# Patient Record
Sex: Female | Born: 1980 | Race: Black or African American | Hispanic: No | Marital: Single | Smoking: Former smoker
Health system: Southern US, Community
[De-identification: ages and names within clinical notes are randomized; demographics above are authoritative.]

## PROBLEM LIST (undated history)

## (undated) DIAGNOSIS — E7800 Pure hypercholesterolemia, unspecified: Secondary | ICD-10-CM

---

## 2012-10-08 ENCOUNTER — Emergency Department (HOSPITAL_BASED_OUTPATIENT_CLINIC_OR_DEPARTMENT_OTHER)
Admission: EM | Admit: 2012-10-08 | Discharge: 2012-10-08 | Disposition: A | Discharge status: Home / Self Care | Payer: PRIVATE HEALTH INSURANCE | Attending: Emergency Medicine | Admitting: Emergency Medicine

## 2012-10-08 ENCOUNTER — Encounter (HOSPITAL_BASED_OUTPATIENT_CLINIC_OR_DEPARTMENT_OTHER): Payer: Self-pay | Admitting: Family Medicine

## 2012-10-08 DIAGNOSIS — Z3202 Encounter for pregnancy test, result negative: Secondary | ICD-10-CM | POA: Insufficient documentation

## 2012-10-08 DIAGNOSIS — K529 Noninfective gastroenteritis and colitis, unspecified: Secondary | ICD-10-CM

## 2012-10-08 DIAGNOSIS — K5289 Other specified noninfective gastroenteritis and colitis: Secondary | ICD-10-CM | POA: Insufficient documentation

## 2012-10-08 DIAGNOSIS — R197 Diarrhea, unspecified: Secondary | ICD-10-CM | POA: Insufficient documentation

## 2012-10-08 DIAGNOSIS — F17200 Nicotine dependence, unspecified, uncomplicated: Secondary | ICD-10-CM | POA: Insufficient documentation

## 2012-10-08 LAB — CBC WITH DIFFERENTIAL/PLATELET
Basophils Relative: 0 % (ref 0–1)
Eosinophils Absolute: 0.1 10*3/uL (ref 0.0–0.7)
MCH: 32.6 pg (ref 26.0–34.0)
MCHC: 35 g/dL (ref 30.0–36.0)
Neutrophils Relative %: 56 % (ref 43–77)
Platelets: 188 10*3/uL (ref 150–400)
RDW: 11.7 % (ref 11.5–15.5)

## 2012-10-08 LAB — URINALYSIS, ROUTINE W REFLEX MICROSCOPIC
Nitrite: NEGATIVE
Specific Gravity, Urine: 1.035 — ABNORMAL HIGH (ref 1.005–1.030)
Urobilinogen, UA: 1 mg/dL (ref 0.0–1.0)

## 2012-10-08 LAB — BASIC METABOLIC PANEL
GFR calc Af Amer: >90 mL/min (ref >90)
GFR calc non Af Amer: >90 mL/min (ref >90)
Potassium: 3.6 mEq/L (ref 3.5–5.1)
Sodium: 140 mEq/L (ref 135–145)

## 2012-10-08 MED ORDER — METOCLOPRAMIDE HCL 10 MG PO TABS: 10.0000 mg | ORAL_TABLET | Freq: Four times a day (QID) | ORAL | Status: DC | PRN

## 2012-10-08 MED ORDER — ONDANSETRON HCL 4 MG/2ML IJ SOLN
4.0000 mg | Freq: Once | INTRAMUSCULAR | Status: AC
  Administered 2012-10-08: 4 mg via INTRAVENOUS
  Filled 2012-10-08: qty 2

## 2012-10-08 MED ORDER — SODIUM CHLORIDE 0.9 % IV SOLN: Freq: Once | INTRAVENOUS | Status: DC

## 2012-10-08 MED ORDER — LOPERAMIDE HCL 2 MG PO CAPS
4.0000 mg | ORAL_CAPSULE | Freq: Once | ORAL | Status: AC
  Administered 2012-10-08: 4 mg via ORAL
  Filled 2012-10-08: qty 2

## 2012-10-08 MED ORDER — SODIUM CHLORIDE 0.9 % IV BOLUS (SEPSIS)
1000.0000 mL | Freq: Once | INTRAVENOUS | Status: AC
  Administered 2012-10-08: 1000 mL via INTRAVENOUS

## 2012-10-08 NOTE — ED Notes (Signed)
Pt c/o vomiting, diarrhea and fever since MOnday. Pt sts fever broke but continues to have vomiting and diarrhea.

## 2012-10-08 NOTE — ED Provider Notes (Signed)
History     CSN: [id number]  Arrival date & time 10/08/12  1021   First MD Initiated Contact with Patient 10/08/12 1033      Chief Complaint  Patient presents with  . Emesis  . Diarrhea    (Consider location/radiation/quality/duration/timing/severity/associated sxs/prior treatment) Patient is a 32 y.o. female presenting with vomiting and diarrhea. The history is provided by the patient.  Emesis  Associated symptoms include diarrhea.  Diarrhea The primary symptoms include vomiting and diarrhea.  She has been sick for the last 2 days. She initially had fever which was as high as 102.1, but is not running a fever today. She has had chills and sweats. She has vomited multiple times and is now vomiting only some yellowish material. She's not been able to hold anything down. She is having 4-5 bowel movements a day. She denies any blood or mucus in the emesis or stool. She has had sick contacts. Symptoms are severe. Nothing makes it better nothing makes it worse. She has not taken anything at home to help.  History reviewed. No pertinent past medical history.  History reviewed. No pertinent past surgical history.  No family history on file.  History  Substance Use Topics  . Smoking status: Current Every Day Smoker  . Smokeless tobacco: Not on file  . Alcohol Use: Yes    OB History    Grav Para Term Preterm Abortions TAB SAB Ect Mult Living                  Review of Systems  Gastrointestinal: Positive for vomiting and diarrhea.  All other systems reviewed and are negative.    Allergies  Review of patient's allergies indicates no known allergies.  Home Medications  No current outpatient prescriptions on file.  BP 127/92  Pulse 125  Temp 98.4 F (36.9 C)  Resp 20  SpO2 100%  LMP 09/30/2012  Physical Exam  Nursing note and vitals reviewed. 32 year old female, resting comfortably and in no acute distress. Vital signs are significant for tachycardia with heart rate  125, and mild hypertension with blood pressure 127/92. Oxygen saturation is 100%, which is normal. Head is normocephalic and atraumatic. PERRLA, EOMI. Oropharynx is clear. Mucous membranes are slightly dry Neck is nontender and supple without adenopathy or JVD. Back is nontender and there is no CVA tenderness. Lungs are clear without rales, wheezes, or rhonchi. Chest is nontender. Heart is tachycardic without murmur. Abdomen is soft, flat, nontender without masses or hepatosplenomegaly and peristalsis is hypoactive. Extremities have no cyanosis or edema, full range of motion is present. Skin is warm and dry without rash. Neurologic: Mental status is normal, cranial nerves are intact, there are no motor or sensory deficits.   ED Course  Procedures (including critical care time)  Results for orders placed during the hospital encounter of 10/08/12  CBC WITH DIFFERENTIAL      Component Value Range   WBC 8.0  4.0 - 10.5 K/uL   RBC 4.29  3.87 - 5.11 MIL/uL   Hemoglobin 14.0  12.0 - 15.0 g/dL   HCT 40.9  81.1 - 91.4 %   MCV 93.2  78.0 - 100.0 fL   MCH 32.6  26.0 - 34.0 pg   MCHC 35.0  30.0 - 36.0 g/dL   RDW 78.2  95.6 - 21.3 %   Platelets 188  150 - 400 K/uL   Neutrophils Relative 56  43 - 77 %   Neutro Abs 4.5  1.7 -  7.7 K/uL   Lymphocytes Relative 34  12 - 46 %   Lymphs Abs 2.7  0.7 - 4.0 K/uL   Monocytes Relative 9  3 - 12 %   Monocytes Absolute 0.7  0.1 - 1.0 K/uL   Eosinophils Relative 1  0 - 5 %   Eosinophils Absolute 0.1  0.0 - 0.7 K/uL   Basophils Relative 0  0 - 1 %   Basophils Absolute 0.0  0.0 - 0.1 K/uL  BASIC METABOLIC PANEL      Component Value Range   Sodium 140  135 - 145 mEq/L   Potassium 3.6  3.5 - 5.1 mEq/L   Chloride 103  96 - 112 mEq/L   CO2 24  19 - 32 mEq/L   Glucose, Bld 136 (*) 70 - 99 mg/dL   BUN 8  6 - 23 mg/dL   Creatinine, Ser 1.61  0.50 - 1.10 mg/dL   Calcium 9.8  8.4 - 09.6 mg/dL   GFR calc non Af Amer >90  >90 mL/min   GFR calc Af Amer >90   >90 mL/min  URINALYSIS, ROUTINE W REFLEX MICROSCOPIC      Component Value Range   Color, Urine AMBER (*) YELLOW   APPearance CLOUDY (*) CLEAR   Specific Gravity, Urine 1.035 (*) 1.005 - 1.030   pH 6.0  5.0 - 8.0   Glucose, UA NEGATIVE  NEGATIVE mg/dL   Hgb urine dipstick NEGATIVE  NEGATIVE   Bilirubin Urine SMALL (*) NEGATIVE   Ketones, ur 40 (*) NEGATIVE mg/dL   Protein, ur NEGATIVE  NEGATIVE mg/dL   Urobilinogen, UA 1.0  0.0 - 1.0 mg/dL   Nitrite NEGATIVE  NEGATIVE   Leukocytes, UA NEGATIVE  NEGATIVE  PREGNANCY, URINE      Component Value Range   Preg Test, Ur NEGATIVE  NEGATIVE    1. Gastroenteritis       MDM  Vomiting and diarrhea which most likely represents viral gastroenteritis. She'll be given IV hydration, IV ondansetron and oral loperamide.  She feels considerably better after above noted treatment. She is discharged with prescription for metoclopramide and told to use over-the-counter loperamide as needed. She is still complaining of some abdominal soreness, but there is no evidence of any other acute abdominal pathology. She is given a work release for the next 36 hours.  Dione Booze, MD 10/08/12 1213

## 2018-08-26 ENCOUNTER — Ambulatory Visit: Payer: Self-pay | Admitting: Nurse Practitioner

## 2018-08-26 VITALS — BP 118/70 | HR 92 | Temp 98.1°F | Resp 16 | Ht 68.0 in | Wt 126.6 lb

## 2018-08-26 DIAGNOSIS — F17200 Nicotine dependence, unspecified, uncomplicated: Secondary | ICD-10-CM

## 2018-08-26 DIAGNOSIS — L732 Hidradenitis suppurativa: Secondary | ICD-10-CM

## 2018-08-26 MED ORDER — DOXYCYCLINE HYCLATE 100 MG PO TABS: 100.0000 mg | ORAL_TABLET | Freq: Two times a day (BID) | ORAL | 0 refills | Status: DC

## 2018-08-26 NOTE — Patient Instructions (Addendum)
Warm compress to area 2-3 times a day Smoking cessation discussed and encouraged Take all of meds as directed Set up mychart RTC if symptoms persist or worsen or see PCP

## 2018-08-26 NOTE — Progress Notes (Signed)
   Subjective:    Patient ID: Kristi Turner, female    DOB: 08/04/1981, 37 y.o.   MRN: [id number]  HPI Kristi Turner [facility] today with c/o left armpit bump that is painful and getting larger. She reports she's unable to shave. When the bump first appeared it wasn't painful but her bra strap has rubbed it. She reports this has been going on x 2 weeks. Was seen at PCP office a few weeks back and had CPE and reports she had the bump at that time but it wasn't painful. Denies fever, or skin issues. Has used heating pad but it hasn't improved.  Kristi Turner admits smokes 4 cigarettes/day and has smoked for 10 years.   Review of Systems  Constitutional: Negative for fever.  Skin:       Painful bump to the left armpit       Objective:   Physical Exam  Constitutional: She is oriented to person, place, and time. She appears well-developed and well-nourished.  HENT:  Head: Normocephalic.  Neck: Normal range of motion.  Pulmonary/Chest: Effort normal. No respiratory distress.  Musculoskeletal: Normal range of motion.  Neurological: She is alert and oriented to person, place, and time.  Skin: Skin is warm and dry.  Left axillary nodule with erythema, tenderness and edema. No discharge but noted another circular lesion at the center of the lesion  Psychiatric: She has a normal mood and affect.  Vitals reviewed.         Assessment & Plan:

## 2018-08-26 NOTE — Progress Notes (Deleted)
Duplicate

## 2019-10-15 ENCOUNTER — Ambulatory Visit: Payer: PRIVATE HEALTH INSURANCE | Admitting: Family Medicine

## 2019-10-15 VITALS — BP 126/74 | HR 94 | Ht 67.0 in | Wt 124.6 lb

## 2019-10-15 DIAGNOSIS — Z789 Other specified health status: Secondary | ICD-10-CM

## 2019-10-15 NOTE — Progress Notes (Signed)
  Subjective:     Patient ID: Kristi Turner, female   DOB: 02/28/1981, 39 y.o.   MRN: [id number]  HPI Yoshie presents to the [facility] clinic today for her required wellness visit for her insurance. We reviewed her PMH and wellness goals and addressed any concerns she has. She does have a PCP that she sees yearly for her physicals.   Pap smear: August 2020 normal  Declined flu vaccine Former smoker, quit 1.5 years ago Denies any health concerns  No past medical history on file. No Known Allergies No current outpatient medications on file.     Review of Systems  Constitutional: Negative for chills, fatigue, fever and unexpected weight change.  HENT: Negative for congestion, ear pain, sinus pressure, sinus pain and sore throat.   Eyes: Negative for discharge and visual disturbance.  Respiratory: Negative for cough, shortness of breath and wheezing.   Cardiovascular: Negative for chest pain and leg swelling.  Gastrointestinal: Negative for abdominal pain, blood in stool, constipation, diarrhea, nausea and vomiting.  Genitourinary: Negative for difficulty urinating and hematuria.  Skin: Negative for color change.  Neurological: Negative for dizziness, weakness, light-headedness and headaches.  Hematological: Negative for adenopathy.  All other systems reviewed and are negative.      Objective:   Physical Exam Vitals and nursing note reviewed.  Constitutional:      General: She is not in acute distress.    Appearance: Normal appearance. She is well-developed. She is not toxic-appearing.  HENT:     Head: Normocephalic and atraumatic.  Eyes:     General:        Right eye: No discharge.        Left eye: No discharge.  Cardiovascular:     Rate and Rhythm: Normal rate and regular rhythm.     Heart sounds: Normal heart sounds. No murmur.  Pulmonary:     Effort: Pulmonary effort is normal. No respiratory distress.     Breath sounds: Normal breath sounds.  Musculoskeletal:      Cervical back: Neck supple.  Skin:    General: Skin is warm and dry.  Neurological:     Mental Status: She is alert and oriented to person, place, and time.  Psychiatric:        Mood and Affect: Mood normal.        Behavior: Behavior normal.        Assessment:     Participant in health and wellness plan      Plan:     1. Pt is doing well. No concerns at this time. Encouraged to continue exercising and following healthy diet. Keep all appointments with PCP. F/u prn.

## 2020-05-31 ENCOUNTER — Encounter (HOSPITAL_BASED_OUTPATIENT_CLINIC_OR_DEPARTMENT_OTHER): Payer: Self-pay | Admitting: *Deleted

## 2020-05-31 ENCOUNTER — Other Ambulatory Visit: Payer: Self-pay

## 2020-05-31 DIAGNOSIS — R002 Palpitations: Secondary | ICD-10-CM | POA: Insufficient documentation

## 2020-05-31 DIAGNOSIS — Z87891 Personal history of nicotine dependence: Secondary | ICD-10-CM | POA: Insufficient documentation

## 2020-05-31 NOTE — ED Triage Notes (Signed)
C/o palpitations x 1 week , cp today , seen by PMD x 4 days ago for same

## 2020-06-01 ENCOUNTER — Emergency Department (HOSPITAL_BASED_OUTPATIENT_CLINIC_OR_DEPARTMENT_OTHER): Payer: PRIVATE HEALTH INSURANCE

## 2020-06-01 ENCOUNTER — Emergency Department (HOSPITAL_BASED_OUTPATIENT_CLINIC_OR_DEPARTMENT_OTHER)
Admission: EM | Admit: 2020-06-01 | Discharge: 2020-06-01 | Disposition: A | Discharge status: Home / Self Care | Payer: PRIVATE HEALTH INSURANCE | Attending: Emergency Medicine | Admitting: Emergency Medicine

## 2020-06-01 ENCOUNTER — Other Ambulatory Visit: Payer: Self-pay

## 2020-06-01 DIAGNOSIS — R002 Palpitations: Secondary | ICD-10-CM

## 2020-06-01 LAB — CBC WITH DIFFERENTIAL/PLATELET
Abs Immature Granulocytes: 0.01 10*3/uL (ref 0.00–0.07)
Basophils Absolute: 0.1 10*3/uL (ref 0.0–0.1)
Basophils Relative: 1 %
Eosinophils Absolute: 0.1 10*3/uL (ref 0.0–0.5)
Eosinophils Relative: 1 %
HCT: 38.8 % (ref 36.0–46.0)
Hemoglobin: 13.3 g/dL (ref 12.0–15.0)
Immature Granulocytes: 0 %
Lymphocytes Relative: 50 %
Lymphs Abs: 5 10*3/uL — ABNORMAL HIGH (ref 0.7–4.0)
MCH: 31.9 pg (ref 26.0–34.0)
MCHC: 34.3 g/dL (ref 30.0–36.0)
MCV: 93 fL (ref 80.0–100.0)
Monocytes Absolute: 1 10*3/uL (ref 0.1–1.0)
Monocytes Relative: 10 %
Neutro Abs: 3.6 10*3/uL (ref 1.7–7.7)
Neutrophils Relative %: 38 %
Platelets: 242 10*3/uL (ref 150–400)
RBC: 4.17 MIL/uL (ref 3.87–5.11)
RDW: 11.5 % (ref 11.5–15.5)
WBC: 9.7 10*3/uL (ref 4.0–10.5)
nRBC: 0 % (ref 0.0–0.2)

## 2020-06-01 LAB — COMPREHENSIVE METABOLIC PANEL
ALT: 9 U/L (ref 0–44)
AST: 18 U/L (ref 15–41)
Albumin: 4.9 g/dL (ref 3.5–5.0)
Alkaline Phosphatase: 53 U/L (ref 38–126)
Anion gap: 11 (ref 5–15)
BUN: 10 mg/dL (ref 6–20)
CO2: 22 mmol/L (ref 22–32)
Calcium: 9.5 mg/dL (ref 8.9–10.3)
Chloride: 104 mmol/L (ref 98–111)
Creatinine, Ser: 0.67 mg/dL (ref 0.44–1.00)
GFR calc Af Amer: >60 mL/min (ref >60)
GFR calc non Af Amer: >60 mL/min (ref >60)
Glucose, Bld: 138 mg/dL — ABNORMAL HIGH (ref 70–99)
Potassium: 3.5 mmol/L (ref 3.5–5.1)
Sodium: 137 mmol/L (ref 135–145)
Total Bilirubin: 1 mg/dL (ref 0.3–1.2)
Total Protein: 8 g/dL (ref 6.5–8.1)

## 2020-06-01 LAB — TROPONIN I (HIGH SENSITIVITY): Troponin I (High Sensitivity): <2 ng/L (ref <18)

## 2020-06-01 LAB — D-DIMER, QUANTITATIVE: D-Dimer, Quant: 0.38 ug/mL-FEU (ref 0.00–0.50)

## 2020-06-01 MED ORDER — SODIUM CHLORIDE 0.9 % IV BOLUS
1000.0000 mL | Freq: Once | INTRAVENOUS | Status: AC
  Administered 2020-06-01: 1000 mL via INTRAVENOUS

## 2020-06-01 NOTE — ED Provider Notes (Signed)
[facility] EMERGENCY DEPARTMENT Provider Note   CSN: [id number] Arrival date & time: 05/31/20  2340     History Chief Complaint  Patient presents with  . Palpitations    Kristi Turner is a 39 y.o. female.  Patient presents to the emergency department with a chief complaint of palpitations.  She states that she has history of the same, but never experienced them quite as bad as she did tonight.  She reports associated chest tightness that is uncomfortable, but she says it is not really a "pain."  She denies any history of PE or DVT.  She states that she does use coffee and occasional energy drinks, but denies any other stimulant use or methamphetamine use.  She denies any recent travel or surgery.  She is not on OCPs.  She has seen her doctor for the same, but has not had a diagnosis established.  She tried taking 20 mg of propranolol which was prescribed by her PCP, but did not notice any improvement.  She is scheduled to see a cardiologist in a few weeks.  The history is provided by the patient. No language interpreter was used.       History reviewed. No pertinent past medical history.  There are no problems to display for this patient.   History reviewed. No pertinent surgical history.   OB History   No obstetric history on file.     No family history on file.  Social History   Tobacco Use  . Smoking status: Former Games developer  . Smokeless tobacco: Never Used  . Tobacco comment: Quit 2019  Substance Use Topics  . Alcohol use: Yes  . Drug use: No    Home Medications Prior to Admission medications   Not on File    Allergies    Patient has no known allergies.  Review of Systems   Review of Systems  All other systems reviewed and are negative.   Physical Exam Updated Vital Signs BP (!) 137/100   Pulse (!) 133   Temp 98.8 F (37.1 C) (Oral)   Resp 18   Ht 5\' 7"  (1.702 m)   Wt 57.6 kg   LMP 05/12/2020   SpO2 99%   BMI 19.89 kg/m    Physical Exam Vitals and nursing note reviewed.  Constitutional:      General: She is not in acute distress.    Appearance: She is well-developed.  HENT:     Head: Normocephalic and atraumatic.  Eyes:     Conjunctiva/sclera: Conjunctivae normal.  Cardiovascular:     Rate and Rhythm: Regular rhythm. Tachycardia present.     Heart sounds: No murmur heard.   Pulmonary:     Effort: Pulmonary effort is normal. No respiratory distress.     Breath sounds: Normal breath sounds.  Abdominal:     Palpations: Abdomen is soft.     Tenderness: There is no abdominal tenderness.  Musculoskeletal:        General: Normal range of motion.     Cervical back: Neck supple.  Skin:    General: Skin is warm and dry.  Neurological:     Mental Status: She is alert and oriented to person, place, and time.  Psychiatric:        Mood and Affect: Mood normal.        Behavior: Behavior normal.     ED Results / Procedures / Treatments   Labs (all labs ordered are listed, but only abnormal results are displayed)  Labs Reviewed  CBC WITH DIFFERENTIAL/PLATELET - Abnormal; Notable for the following components:      Result Value   Lymphs Abs 5.0 (*)    All other components within normal limits  COMPREHENSIVE METABOLIC PANEL - Abnormal; Notable for the following components:   Glucose, Bld 138 (*)    All other components within normal limits  D-DIMER, QUANTITATIVE (NOT AT [facility])  TROPONIN I (HIGH SENSITIVITY)    EKG EKG Interpretation  Date/Time:  Wednesday June 01 2020 00:01:18 EDT Ventricular Rate:  134 PR Interval:    QRS Duration: 78 QT Interval:  364 QTC Calculation: 543 R Axis:   82 Text Interpretation: Sinus tachycardia Confirmed by Nicanor Alcon, April ([id number]) on 06/01/2020 5:27:49 AM 5573 ED ECG REPORT  I personally interpreted this EKG   Date: 06/01/2020   Rate: 107  Rhythm: sinus tachycardia  QRS Axis: normal  Intervals: 345/461  ST/T Wave abnormalities: normal  Conduction  Disutrbances:none  Narrative Interpretation: slower  Old EKG Reviewed: changes noted    Radiology DG Chest 2 View  Result Date: 06/01/2020 CLINICAL DATA:  Palpitations for 1 week, chest pain today EXAM: CHEST - 2 VIEW COMPARISON:  None. FINDINGS: The heart size and mediastinal contours are within normal limits. Both lungs are clear. The visualized skeletal structures are unremarkable. IMPRESSION: No active cardiopulmonary disease. Electronically Signed   By: Sharlet Salina M.D.   On: 06/01/2020 00:21    Procedures Procedures (including critical care time)  Medications Ordered in ED Medications  sodium chloride 0.9 % bolus 1,000 mL (has no administration in time range)    ED Course  I have reviewed the triage vital signs and the nursing notes.  Pertinent labs & imaging results that were available during my care of the patient were reviewed by me and considered in my medical decision making (see chart for details).    MDM Rules/Calculators/A&P                          This patient complains of palpitations, this involves an extensive number of treatment options, and is a complaint that carries with it a high risk of complications and morbidity.    Pertinent Labs I ordered, reviewed, and interpreted labs, which included trop negative, d-dimer negative, doubt PE.  Electrolytes and CBC reassuring.  Imaging Interpretation I ordered imaging studies which included CXR.  I independently visualized and interpreted the CXR, which showed negative.   Medications I ordered medication saline for palpitations.   Reassessments After the interventions stated above, I reevaluated the patient and found feeling improved.  Consultants Discussed with Dr. Nicanor Alcon, who reviewed repeat EKG with me,   Plan Discharge and outpatient follow-up.     Final Clinical Impression(s) / ED Diagnoses Final diagnoses:  Palpitations    Rx / DC Orders ED Discharge Orders    None       Roxy Horseman, PA-C 06/01/20 0542    Palumbo, April, MD 06/01/20 [phone]

## 2021-03-30 ENCOUNTER — Encounter: Payer: Self-pay | Admitting: Family

## 2021-03-30 ENCOUNTER — Ambulatory Visit: Payer: PRIVATE HEALTH INSURANCE | Admitting: Family

## 2021-03-30 VITALS — BP 142/80 | HR 86 | Resp 16 | Ht 68.0 in | Wt 143.0 lb

## 2021-03-30 NOTE — Progress Notes (Signed)
  Subjective:     Patient ID: Kristi Turner, female   DOB: 02-10-1981, 40 y.o.   MRN: [id number]  HPI Breleigh presents to the [facility] clinic today for her required wellness visit for her insurance. We reviewed her PMH and wellness goals and addressed any concerns she has. She does have a PCP that she sees yearly for her physicals.   Pap smear: done annually Declined flu vaccine Former smoker, quit 10 months ago Denies any health concerns  History reviewed. No pertinent past medical history. No Known Allergies  Current Outpatient Medications:    EQ NICOTINE 14 MG/24HR patch, 14 mg daily., Disp: , Rfl:      Review of Systems  Constitutional:  Negative for chills, fatigue, fever and unexpected weight change.  HENT:  Negative for congestion, ear pain, sinus pressure, sinus pain and sore throat.   Eyes:  Negative for discharge and visual disturbance.  Respiratory:  Negative for cough, shortness of breath and wheezing.   Cardiovascular:  Negative for chest pain and leg swelling.  Gastrointestinal:  Negative for abdominal pain, blood in stool, constipation, diarrhea, nausea and vomiting.  Genitourinary:  Negative for difficulty urinating and hematuria.  Skin:  Negative for color change.  Neurological:  Negative for dizziness, weakness, light-headedness and headaches.  Hematological:  Negative for adenopathy.  All other systems reviewed and are negative.     Objective:   Physical Exam Vitals and nursing note reviewed.  Constitutional:      General: She is not in acute distress.    Appearance: Normal appearance. She is well-developed. She is not toxic-appearing.  HENT:     Head: Normocephalic and atraumatic.  Eyes:     General:        Right eye: No discharge.        Left eye: No discharge.  Cardiovascular:     Rate and Rhythm: Normal rate and regular rhythm.     Heart sounds: Normal heart sounds. No murmur heard. Pulmonary:     Effort: Pulmonary effort is normal. No respiratory  distress.     Breath sounds: Normal breath sounds.  Musculoskeletal:     Cervical back: Neck supple.  Skin:    General: Skin is warm and dry.  Neurological:     Mental Status: She is alert and oriented to person, place, and time.  Psychiatric:        Mood and Affect: Mood normal.        Behavior: Behavior normal.       Assessment:     No diagnosis found.      Plan:     1. Pt is doing well. No concerns at this time. Encouraged to continue exercising and following healthy diet. Keep all appointments with PCP. F/u prn.

## 2022-03-16 ENCOUNTER — Other Ambulatory Visit: Payer: Self-pay

## 2022-03-16 ENCOUNTER — Emergency Department (HOSPITAL_COMMUNITY)
Admission: EM | Admit: 2022-03-16 | Discharge: 2022-03-16 | Disposition: A | Discharge status: Home / Self Care | Payer: PRIVATE HEALTH INSURANCE | Attending: Student | Admitting: Student

## 2022-03-16 ENCOUNTER — Emergency Department (HOSPITAL_COMMUNITY): Payer: PRIVATE HEALTH INSURANCE

## 2022-03-16 ENCOUNTER — Encounter (HOSPITAL_COMMUNITY): Payer: Self-pay

## 2022-03-16 DIAGNOSIS — R0789 Other chest pain: Secondary | ICD-10-CM | POA: Insufficient documentation

## 2022-03-16 DIAGNOSIS — R0602 Shortness of breath: Secondary | ICD-10-CM | POA: Insufficient documentation

## 2022-03-16 DIAGNOSIS — R079 Chest pain, unspecified: Secondary | ICD-10-CM

## 2022-03-16 LAB — CBC WITH DIFFERENTIAL/PLATELET
Abs Immature Granulocytes: 0.03 10*3/uL (ref 0.00–0.07)
Basophils Absolute: 0 10*3/uL (ref 0.0–0.1)
Basophils Relative: 1 %
Eosinophils Absolute: 0 10*3/uL (ref 0.0–0.5)
Eosinophils Relative: 1 %
HCT: 40 % (ref 36.0–46.0)
Hemoglobin: 13.5 g/dL (ref 12.0–15.0)
Immature Granulocytes: 0 %
Lymphocytes Relative: 27 %
Lymphs Abs: 2.3 10*3/uL (ref 0.7–4.0)
MCH: 32.3 pg (ref 26.0–34.0)
MCHC: 33.8 g/dL (ref 30.0–36.0)
MCV: 95.7 fL (ref 80.0–100.0)
Monocytes Absolute: 0.6 10*3/uL (ref 0.1–1.0)
Monocytes Relative: 7 %
Neutro Abs: 5.5 10*3/uL (ref 1.7–7.7)
Neutrophils Relative %: 64 %
Platelets: 230 10*3/uL (ref 150–400)
RBC: 4.18 MIL/uL (ref 3.87–5.11)
RDW: 12.1 % (ref 11.5–15.5)
WBC: 8.5 10*3/uL (ref 4.0–10.5)
nRBC: 0 % (ref 0.0–0.2)

## 2022-03-16 LAB — I-STAT BETA HCG BLOOD, ED (MC, WL, AP ONLY): I-stat hCG, quantitative: <5 m[IU]/mL (ref <5)

## 2022-03-16 LAB — BASIC METABOLIC PANEL
Anion gap: 5 (ref 5–15)
BUN: 12 mg/dL (ref 6–20)
CO2: 25 mmol/L (ref 22–32)
Calcium: 9.6 mg/dL (ref 8.9–10.3)
Chloride: 108 mmol/L (ref 98–111)
Creatinine, Ser: 0.7 mg/dL (ref 0.44–1.00)
GFR, Estimated: >60 mL/min (ref >60)
Glucose, Bld: 104 mg/dL — ABNORMAL HIGH (ref 70–99)
Potassium: 3.8 mmol/L (ref 3.5–5.1)
Sodium: 138 mmol/L (ref 135–145)

## 2022-03-16 LAB — TROPONIN I (HIGH SENSITIVITY)
Troponin I (High Sensitivity): <2 ng/L (ref <18)
Troponin I (High Sensitivity): <2 ng/L (ref <18)

## 2022-03-16 MED ORDER — NAPROXEN 500 MG PO TABS
500.0000 mg | ORAL_TABLET | Freq: Two times a day (BID) | ORAL | 0 refills | Status: DC
Start: 2022-03-16 — End: 2022-06-28

## 2022-03-16 MED ORDER — KETOROLAC TROMETHAMINE 30 MG/ML IJ SOLN
15.0000 mg | Freq: Once | INTRAMUSCULAR | Status: AC
  Administered 2022-03-16: 15 mg via INTRAVENOUS
  Filled 2022-03-16: qty 1

## 2022-03-16 NOTE — ED Provider Notes (Cosign Needed Addendum)
[facility]-EMERGENCY DEPT Provider Note   CSN: [id number] Arrival date & time: 03/16/22  0915     History  No chief complaint on file.   Kristi Turner is a 41 y.o. female.  Kristi Turner is a 41 y.o. female with history of anxiety, GERD and sinus tachycardia, who presents to the emergency department for evaluation of chest pain.  Patient reports she has been having intermittent chest pains and is scheduled with her cardiologist at [facility] to have a stress test done on June 26.  She reports that she typically has intermittent central chest pains but this morning about an hour to an hour and a half prior to arrival she has been having persistent left-sided chest pain described as a pressure-like sensation that is worse with movement, palpation and sometimes worse with deep breath.  She reports shortness of breath occasionally during chest pain when it is severe.  No lower extremity swelling or pain.  No abdominal pain, no nausea or vomiting.  No diaphoresis, no cough or fever.  No history of PE, no recent travel or surgery.        Home Medications Prior to Admission medications   Medication Sig Start Date End Date Taking? Authorizing Provider  atorvastatin (LIPITOR) 10 MG tablet Take 10 mg by mouth at bedtime. 03/05/22  Yes [provider]  busPIRone (BUSPAR) 10 MG tablet Take 10 mg by mouth 3 (three) times daily. 03/09/22  Yes [provider]  DILT-XR 180 MG 24 hr capsule Take 180 mg by mouth at bedtime. 03/09/22  Yes [provider]  naproxen (NAPROSYN) 500 MG tablet Take 1 tablet (500 mg total) by mouth 2 (two) times daily. 03/16/22  Yes Dartha Lodge, PA-C  pantoprazole (PROTONIX) 40 MG tablet Take 40 mg by mouth 3 (three) times daily before meals. 03/09/22  Yes [provider]  triamcinolone cream (KENALOG) 0.5 % Apply 1 Application topically 2 (two) times daily as needed (rash). 02/02/22  Yes [provider]      Allergies    Patient has no known allergies.    Review of Systems   Review of Systems  Constitutional:  Negative for chills and fever.  Respiratory:  Negative for cough and shortness of breath.   Cardiovascular:  Positive for chest pain.  Gastrointestinal:  Negative for abdominal pain, nausea and vomiting.  All other systems reviewed and are negative.   Physical Exam Updated Vital Signs BP (!) 147/87 (BP Location: Right Arm)   Pulse 94   Temp 98.4 F (36.9 C) (Oral)   Resp 19   Ht 5\' 8"  (1.727 m)   Wt 63.5 kg   SpO2 100%   BMI 21.29 kg/m  Physical Exam Vitals and nursing note reviewed.  Constitutional:      General: She is not in acute distress.    Appearance: Normal appearance. She is well-developed. She is not diaphoretic.  HENT:     Head: Normocephalic and atraumatic.  Eyes:     General:        Right eye: No discharge.        Left eye: No discharge.     Pupils: Pupils are equal, round, and reactive to light.  Cardiovascular:     Rate and Rhythm: Normal rate and regular rhythm.     Pulses: Normal pulses.     Heart sounds: Normal heart sounds.  Pulmonary:     Effort: Pulmonary effort is normal. No respiratory distress.  Breath sounds: Normal breath sounds. No wheezing or rales.     Comments: Respirations equal and unlabored, patient able to speak in full sentences, lungs clear to auscultation bilaterally.  No overlying skin changes over the chest wall but there is tenderness primarily over the left costochondral margin, no palpable deformity. Chest:     Chest wall: Tenderness present.  Abdominal:     General: Bowel sounds are normal. There is no distension.     Palpations: Abdomen is soft. There is no mass.     Tenderness: There is no abdominal tenderness. There is no guarding.     Comments: Abdomen soft, nondistended, nontender to palpation in all quadrants without guarding or peritoneal signs  Musculoskeletal:        General: No deformity.      Cervical back: Neck supple.  Skin:    General: Skin is warm and dry.     Capillary Refill: Capillary refill takes less than 2 seconds.  Neurological:     Mental Status: She is alert and oriented to person, place, and time.     Coordination: Coordination normal.     Comments: Speech is clear, able to follow commands Moves extremities without ataxia, coordination intact  Psychiatric:        Mood and Affect: Mood normal.        Behavior: Behavior normal.     ED Results / Procedures / Treatments   Labs (all labs ordered are listed, but only abnormal results are displayed) Labs Reviewed  BASIC METABOLIC PANEL - Abnormal; Notable for the following components:      Result Value   Glucose, Bld 104 (*)    All other components within normal limits  CBC WITH DIFFERENTIAL/PLATELET  I-STAT BETA HCG BLOOD, ED ([facility], [facility], [facility] ONLY)  TROPONIN I (HIGH SENSITIVITY)  TROPONIN I (HIGH SENSITIVITY)    EKG None  Radiology DG Chest 2 View  Result Date: 03/16/2022 CLINICAL DATA:  Chest pain EXAM: CHEST - 2 VIEW COMPARISON:  06/01/2020 FINDINGS: The heart size and mediastinal contours are within normal limits. Both lungs are clear. The visualized skeletal structures are unremarkable. IMPRESSION: No active cardiopulmonary disease. Electronically Signed   By: Judie Petit.  Shick M.D.   On: 03/16/2022 10:59    Procedures Procedures    Medications Ordered in ED Medications  ketorolac (TORADOL) 30 MG/ML injection 15 mg (15 mg Intravenous Given 03/16/22 1019)    ED Course/ Medical Decision Making/ A&P                           Medical Decision Making Amount and/or Complexity of Data Reviewed Labs: ordered. Radiology: ordered.  Risk Prescription drug management.   Patient presents to the emergency department with chest pain. Patient nontoxic appearing, in no apparent distress, vitals without significant abnormality. Fairly benign physical exam.   DDX including but not limited to: ACS, pulmonary  embolism, dissection, pneumothorax, pneumonia, arrhythmia, severe anemia, MSK, GERD, anxiety, abdominal process.   Additional history obtained:  Chart & nursing note reviewed.  Patient is following with cardiologist at [facility], unable to review the records  EKG: NSR without ischemic changes  Lab Tests:  I reviewed & interpreted labs including: No leukocytosis, normal hemoglobin, normal renal function, no electrolyte derangements, troponin negative x2.  Negative pregnancy  Imaging Studies ordered:  I ordered and viewed the following imaging, agree with radiologist impression:  No active cardiopulmonary disease on chest x-ray  ED Course:  I  ordered medications including Toradol for potential costochondritis or musculoskeletal pain  RE-EVAL: Pain improved and now more localized to the left costochondral margin  EKG without obvious acute ischemia, delta troponin negative, doubt ACS. Patient is low risk wells, PERC negative, doubt pulmonary embolism. Pain is not a tearing sensation, symmetric pulses, no widening of mediastinum on CXR, doubt dissection. Cardiac monitor reviewed, no notable arrhythmias or tachycardia   Based on patient's chief complaint, I considered admission might be necessary, however after reassuring ED workup feel patient is reasonable for discharge.   I discussed results, treatment plan, need for PCP and cardiology follow-up, and return precautions with the patient. Provided opportunity for questions, patient confirmed understanding and is in agreement with plan.    Social determinants:  -Tobacco use, counseled on smoking cessation  Portions of this note were generated with Scientist, clinical (histocompatibility and immunogenetics). Dictation errors may occur despite best attempts at proofreading.         Final Clinical Impression(s) / ED Diagnoses Final diagnoses:  Left-sided chest pain    Rx / DC Orders ED Discharge Orders          Ordered    naproxen (NAPROSYN) 500 MG  tablet  2 times daily        03/16/22 1408              Dartha Lodge, [place] 03/16/22 1409    Dartha Lodge, PA-C 03/16/22 1625

## 2022-03-16 NOTE — ED Triage Notes (Signed)
Patient states chest pain began 1 week ago. Patient states pain progressed 20 minutes ago. States feels like pressure under the left breast. Patient has scheduled stress test on the 26th.

## 2022-03-16 NOTE — Discharge Instructions (Addendum)
You were seen in the emergency department today for chest pain. Your work-up in the emergency department has been overall reassuring. Your labs have been fairly normal and or similar to previous blood work you have had done. Your EKG and the enzyme we use to check your heart did not show an acute heart attack at this time. Your chest x-ray was normal.   Suspect your pain may be due to inflammation of the chest wall or cartilage called costochondritis.  You can treat this with prescribed Naprosyn twice daily.  You can also use over-the-counter Salonpas lidocaine patches, ice and heat.  We would like you to follow up closely with your primary care provider and the cardiologist as planned. Return to the ER immediately should you experience any new or worsening symptoms including but not limited to return of pain, worsened pain, vomiting, shortness of breath, dizziness, lightheadedness, passing out, or any other concerns that you may have.

## 2022-06-09 IMAGING — DX DG CHEST 2V
2 series · 2 of 2 positions shown · non-contrast
Comparison: None.

CLINICAL DATA: Palpitations for 1 week, chest pain today

EXAM:
CHEST - 2 VIEW

[chest pa]
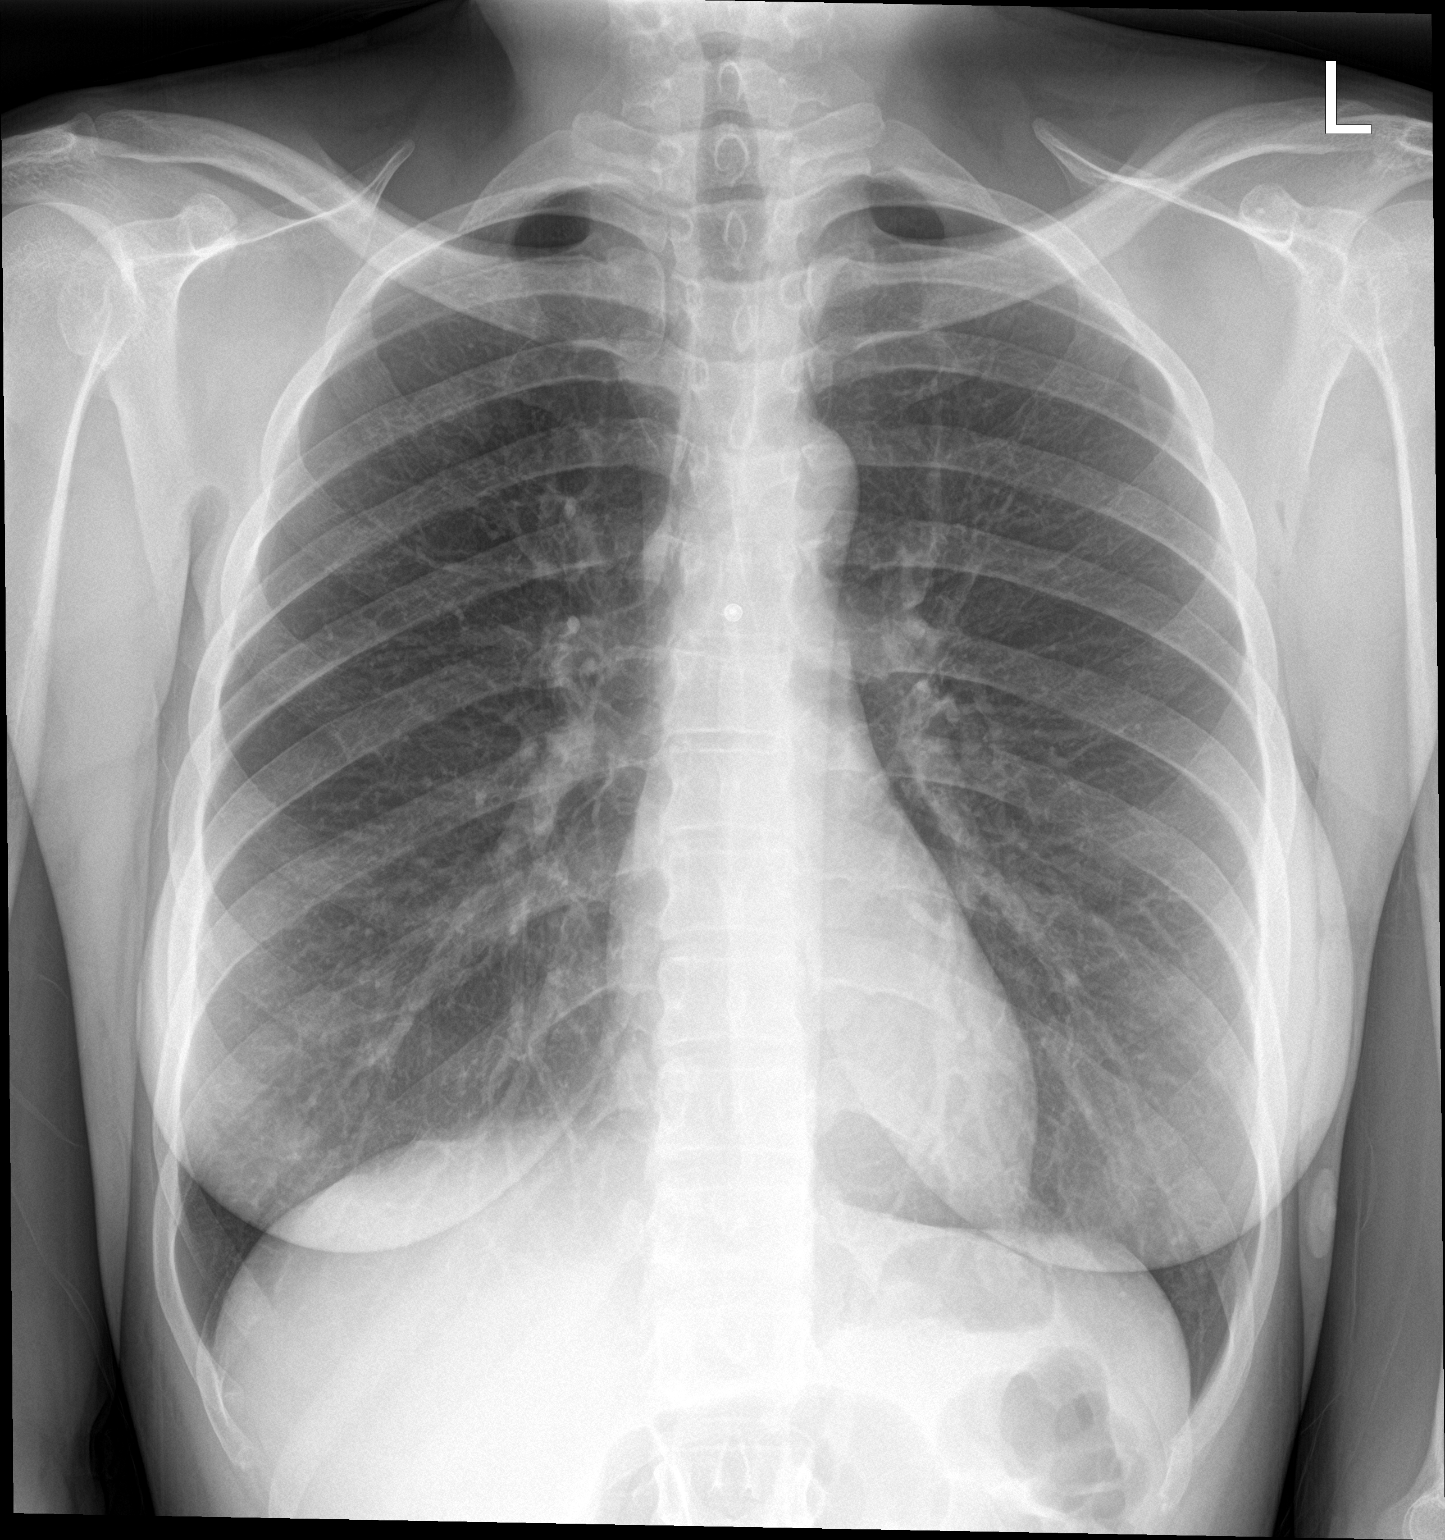

[chest lat]
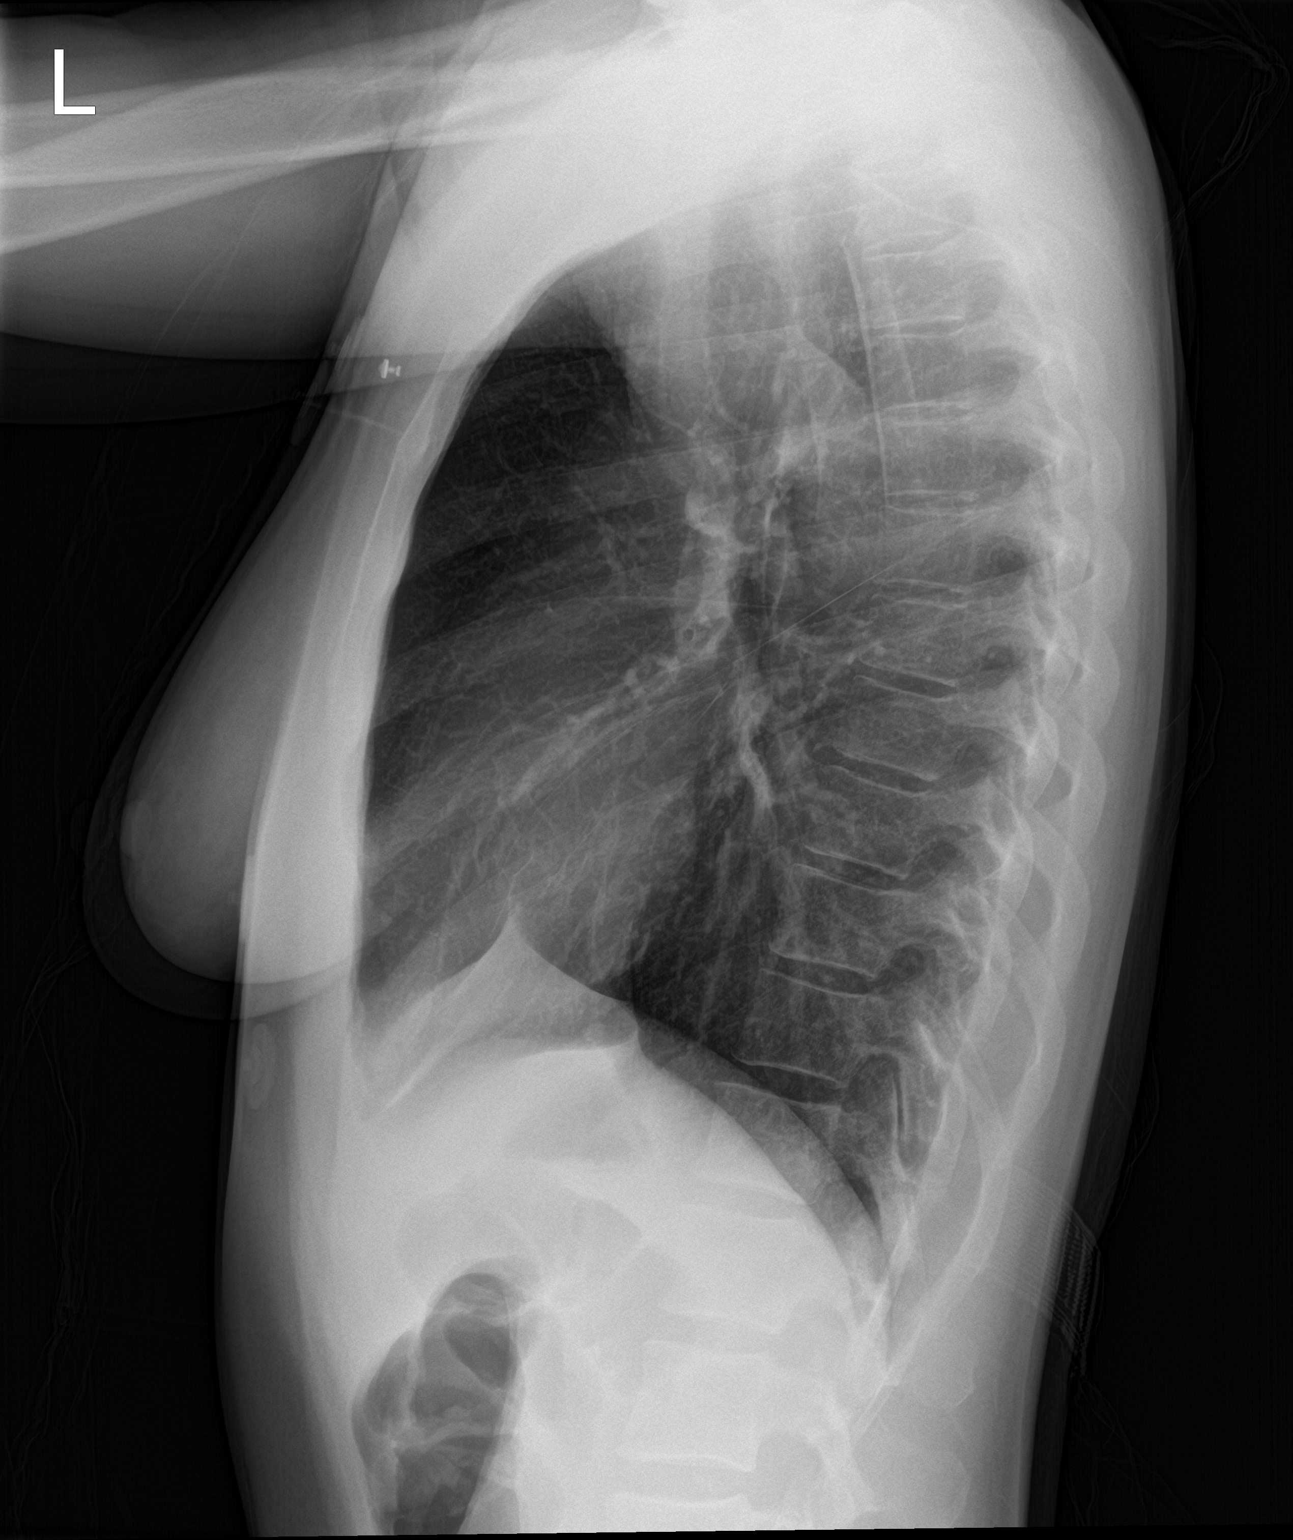

[2 of 2 positions shown; findings below may reference images not displayed]

FINDINGS: The heart size and mediastinal contours are within normal limits.
Both lungs are clear. The visualized skeletal structures are
unremarkable.
IMPRESSION: No active cardiopulmonary disease.

## 2022-06-28 ENCOUNTER — Encounter: Payer: Self-pay | Admitting: Nurse Practitioner

## 2022-06-28 ENCOUNTER — Ambulatory Visit: Payer: PRIVATE HEALTH INSURANCE | Admitting: Nurse Practitioner

## 2022-06-28 VITALS — BP 124/80 | HR 88

## 2022-06-28 DIAGNOSIS — Z1231 Encounter for screening mammogram for malignant neoplasm of breast: Secondary | ICD-10-CM

## 2022-06-28 DIAGNOSIS — Z789 Other specified health status: Secondary | ICD-10-CM

## 2022-06-28 NOTE — Progress Notes (Signed)
Subjective:  Patient ID: Kristi Turner, female    DOB: Feb 28, 1981  Age: 41 y.o. MRN: [id number]  CC: Wellness exam   HPI Kristi Turner presents for wellness exam visit for insurance benefit.  Patient has a PCP: Lattie Haw with [facility].  PMH significant for: Tachycardia on Diltiazem.  Last labs per PCP were completed: Aug 2023  Health Maintenance:  Mammogram: Due for this, never done before. Reports several maternal aunts were diagnosed with breast cancer in their late 69s and 1s.  Pap: August 2022    Smoker: former   Immunizations:  Tdap; reports up to date. 2020 COVID- x2, declines boosters.  Lifestyle: Diet- eats anything Exercise- Stays pretty active at work.     No past medical history on file.  No past surgical history on file.  Outpatient Medications Prior to Visit  Medication Sig Dispense Refill   atorvastatin (LIPITOR) 10 MG tablet Take 10 mg by mouth at bedtime.     DILT-XR 180 MG 24 hr capsule Take 180 mg by mouth at bedtime.     Vitamin D, Ergocalciferol, (DRISDOL) 1.25 MG (50000 UNIT) CAPS capsule Take 50,000 Units by mouth every 7 (seven) days.     busPIRone (BUSPAR) 10 MG tablet Take 10 mg by mouth 3 (three) times daily. (Patient not taking: Reported on 06/28/2022)     naproxen (NAPROSYN) 500 MG tablet Take 1 tablet (500 mg total) by mouth 2 (two) times daily. (Patient not taking: Reported on 06/28/2022) 30 tablet 0   pantoprazole (PROTONIX) 40 MG tablet Take 40 mg by mouth 3 (three) times daily before meals. (Patient not taking: Reported on 06/28/2022)     triamcinolone cream (KENALOG) 0.5 % Apply 1 Application topically 2 (two) times daily as needed (rash). (Patient not taking: Reported on 06/28/2022)     No facility-administered medications prior to visit.    ROS Review of Systems  Respiratory:  Negative for shortness of breath.   Cardiovascular:  Negative for chest pain, palpitations and leg swelling.  Neurological:  Negative for  headaches.    Objective:  BP 124/80   Pulse 88   SpO2 98%   Physical Exam Constitutional:      General: She is not in acute distress. HENT:     Head: Normocephalic.  Cardiovascular:     Rate and Rhythm: Normal rate and regular rhythm.     Heart sounds: Normal heart sounds.  Pulmonary:     Effort: Pulmonary effort is normal.     Breath sounds: Normal breath sounds.  Musculoskeletal:        General: Normal range of motion.  Skin:    General: Skin is warm.  Neurological:     General: No focal deficit present.     Mental Status: She is alert and oriented to person, place, and time.  Psychiatric:        Mood and Affect: Mood normal.        Behavior: Behavior normal.      Assessment & Plan:    Ione was seen today for wellness exam.  Diagnoses and all orders for this visit:  Participant in health and wellness plan Adult wellness physical was conducted today. Importance of diet and exercise were discussed in detail.  We reviewed immunizations and gave recommendations regarding current immunization needed for age.  Preventative health exams needed: Mammogram. Patient is interested in obtaining screening mammogram via mobile mammogram bus at [facility], November first. Order has been placed for this.  Patient was advised yearly wellness exam and follow-up with PCP as needed   Encounter for screening mammogram for breast cancer -     MM 3D SCREEN BREAST BILATERAL; Future    Orders Placed This Encounter  Procedures   MM 3D SCREEN BREAST BILATERAL    No orders of the defined types were placed in this encounter.   Follow-up: as needed.

## 2022-08-01 ENCOUNTER — Other Ambulatory Visit: Payer: Self-pay | Admitting: Nurse Practitioner

## 2022-08-01 DIAGNOSIS — Z1231 Encounter for screening mammogram for malignant neoplasm of breast: Secondary | ICD-10-CM

## 2023-01-01 ENCOUNTER — Encounter (HOSPITAL_BASED_OUTPATIENT_CLINIC_OR_DEPARTMENT_OTHER): Payer: Self-pay | Admitting: Emergency Medicine

## 2023-01-01 ENCOUNTER — Other Ambulatory Visit: Payer: Self-pay

## 2023-01-01 ENCOUNTER — Emergency Department (HOSPITAL_BASED_OUTPATIENT_CLINIC_OR_DEPARTMENT_OTHER): Payer: PRIVATE HEALTH INSURANCE

## 2023-01-01 ENCOUNTER — Emergency Department (HOSPITAL_BASED_OUTPATIENT_CLINIC_OR_DEPARTMENT_OTHER)
Admission: EM | Admit: 2023-01-01 | Discharge: 2023-01-01 | Disposition: A | Discharge status: Home / Self Care | Payer: PRIVATE HEALTH INSURANCE | Attending: Emergency Medicine | Admitting: Emergency Medicine

## 2023-01-01 DIAGNOSIS — R000 Tachycardia, unspecified: Secondary | ICD-10-CM | POA: Diagnosis not present

## 2023-01-01 DIAGNOSIS — M79672 Pain in left foot: Secondary | ICD-10-CM | POA: Diagnosis not present

## 2023-01-01 DIAGNOSIS — M25511 Pain in right shoulder: Secondary | ICD-10-CM | POA: Diagnosis not present

## 2023-01-01 DIAGNOSIS — M542 Cervicalgia: Secondary | ICD-10-CM | POA: Insufficient documentation

## 2023-01-01 DIAGNOSIS — M25512 Pain in left shoulder: Secondary | ICD-10-CM | POA: Insufficient documentation

## 2023-01-01 HISTORY — DX: Pure hypercholesterolemia, unspecified: E78.00

## 2023-01-01 LAB — CBC
HCT: 40.7 % (ref 36.0–46.0)
Hemoglobin: 14.3 g/dL (ref 12.0–15.0)
MCH: 32.4 pg (ref 26.0–34.0)
MCHC: 35.1 g/dL (ref 30.0–36.0)
MCV: 92.1 fL (ref 80.0–100.0)
Platelets: 228 10*3/uL (ref 150–400)
RBC: 4.42 MIL/uL (ref 3.87–5.11)
RDW: 11.7 % (ref 11.5–15.5)
WBC: 8.5 10*3/uL (ref 4.0–10.5)
nRBC: 0 % (ref 0.0–0.2)

## 2023-01-01 LAB — BASIC METABOLIC PANEL
Anion gap: 8 (ref 5–15)
BUN: 6 mg/dL (ref 6–20)
CO2: 24 mmol/L (ref 22–32)
Calcium: 9.4 mg/dL (ref 8.9–10.3)
Chloride: 102 mmol/L (ref 98–111)
Creatinine, Ser: 0.64 mg/dL (ref 0.44–1.00)
GFR, Estimated: >60 mL/min (ref >60)
Glucose, Bld: 96 mg/dL (ref 70–99)
Potassium: 3.4 mmol/L — ABNORMAL LOW (ref 3.5–5.1)
Sodium: 134 mmol/L — ABNORMAL LOW (ref 135–145)

## 2023-01-01 MED ORDER — LIDOCAINE 5 % EX PTCH
1.0000 | MEDICATED_PATCH | CUTANEOUS | Status: DC
  Administered 2023-01-01: 1 via TRANSDERMAL
  Filled 2023-01-01: qty 1

## 2023-01-01 NOTE — Discharge Instructions (Signed)
Return to the ED with any new or worsening signs or symptoms such as chest pain or shortness of breath Please follow-up with your PCP for further management You may continue taking ibuprofen or Tylenol for the pain in your foot. If you develop one-sided weakness, facial droop, slurred speech please report back to the ED immediately

## 2023-01-01 NOTE — ED Triage Notes (Addendum)
Pt c/o neck pain, bilateral arm pain and numbness, and intermittent "burning" left foot pain starting this am, pt reports she was recently told to take her BP meds PRN instead of scheduled, pt further reports she is on a Holter heart monitor x 1 week due to tachycardia

## 2023-01-01 NOTE — ED Provider Notes (Signed)
[place] EMERGENCY DEPARTMENT AT [facility] Provider Note   CSN: [id number] Arrival date & time: 01/01/23  1307     History  Chief Complaint  Patient presents with   multiple complaints     Kristi Turner is a 42 y.o. female with medical history of hypercholesterolemia, tachycardia currently being followed by cardiology.  Patient presents to the ED for evaluation of neck pain, bilateral shoulder numbness, pain to the dorsum of her left foot.  The patient reports that all the symptoms began 2 days ago.  Patient reports that she recently purchased a new bed that will elevate and flatten.  Patient reports that 2 nights ago she attempted to adjust her bed to new position and has had resulting neck pain since.  Patient reports that the pain is located to the left side of her neck, is worse with movement.  Patient denies medications prior to arrival.  Patient also reports that when she developed neck pain, she also developed shoulder pain/numbness to her bilateral shoulders.  Patient is on to state that she also has pain to the top of her left foot, denies trauma, denies skin change.  Patient reports this pain will wake her up in middle the night and has done so for the last 2 weeks.  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  Patient denies history of diabetes.  Patient denies fevers, nausea, vomiting, abdominal pain, chest pain, shortness of breath.  The patient is tachycardic, reports that she is currently wearing a Holter monitor secondary to her tachycardia.  Patient reports that this has been an ongoing issue for the last 2 years and is currently being followed by cardiology.  Patient reports she takes diltiazem, takes it as needed and did take it today.  HPI     Home Medications Prior to Admission medications   Medication Sig Start Date End Date Taking? Authorizing Provider  atorvastatin (LIPITOR) 10 MG tablet Take 10 mg by  mouth at bedtime. 03/05/22   [provider]  DILT-XR 180 MG 24 hr capsule Take 180 mg by mouth at bedtime. 03/09/22   [provider]  Vitamin D, Ergocalciferol, (DRISDOL) 1.25 MG (50000 UNIT) CAPS capsule Take 50,000 Units by mouth every 7 (seven) days.    [provider]      Allergies    Beta adrenergic blockers    Review of Systems   Review of Systems  Respiratory:  Negative for shortness of breath.   Cardiovascular:  Negative for chest pain.  Musculoskeletal:  Positive for myalgias and neck pain.  Neurological:  Positive for numbness.  All other systems reviewed and are negative.   Physical Exam Updated Vital Signs BP (!) 149/86   Pulse 90   Temp 98.3 F (36.8 C) (Oral)   Resp 16   Ht 5\' 8"  (1.727 m)   Wt 65.8 kg   LMP 12/27/2022 (Exact Date)   SpO2 100%   BMI 22.05 kg/m  Physical Exam Vitals and nursing note reviewed.  Constitutional:      General: She is not in acute distress.    Appearance: Normal appearance. She is not toxic-appearing.  HENT:     Head: Normocephalic and atraumatic.     Nose: Nose normal.     Mouth/Throat:     Mouth: Mucous membranes are moist.     Pharynx: Oropharynx is clear.  Eyes:     Extraocular Movements: Extraocular  movements intact.     Conjunctiva/sclera: Conjunctivae normal.     Pupils: Pupils are equal, round, and reactive to light.  Neck:     Comments: Left-sided trapezius tenderness to palpation.  Full range of motion intact. Cardiovascular:     Rate and Rhythm: Regular rhythm. Tachycardia present.     Pulses:          Dorsalis pedis pulses are 2+ on the right side and 2+ on the left side.  Pulmonary:     Effort: Pulmonary effort is normal.     Breath sounds: Normal breath sounds. No wheezing.  Abdominal:     General: Abdomen is flat. Bowel sounds are normal.     Palpations: Abdomen is soft.     Tenderness: There is no abdominal tenderness.  Musculoskeletal:     Cervical back: Normal range of  motion. Tenderness present.     Right lower leg: No edema.     Left lower leg: No edema.     Comments: Subjective sensation intact per patient.  Full range of motion of bilateral shoulders noted.  No deformity.  Feet:     Comments: Patient left without overlying skin change, deformity.  Talus to be Filson to left foot.  Full range of motion ankle. Skin:    General: Skin is warm and dry.     Capillary Refill: Capillary refill takes less than 2 seconds.  Neurological:     General: No focal deficit present.     Mental Status: She is alert and oriented to person, place, and time.     GCS: GCS eye subscore is 4. GCS verbal subscore is 5. GCS motor subscore is 6.     Cranial Nerves: Cranial nerves 2-12 are intact. No cranial nerve deficit.     Sensory: Sensation is intact. No sensory deficit.     Motor: Motor function is intact. No weakness.     Coordination: Coordination is intact. Heel to [facility] Test normal.     Comments: Cranial nerves II through XII intact.  Intact finger-nose and heel-to-shin.  5 out of 5 strength bilateral upper and lower extremities.     ED Results / Procedures / Treatments   Labs (all labs ordered are listed, but only abnormal results are displayed) Labs Reviewed  BASIC METABOLIC PANEL - Abnormal; Notable for the following components:      Result Value   Sodium 134 (*)    Potassium 3.4 (*)    All other components within normal limits  CBC    EKG EKG Interpretation  Date/Time:  Tuesday January 01 2023 13:21:37 EDT Ventricular Rate:  110 PR Interval:  143 QRS Duration: 85 QT Interval:  346 QTC Calculation: 468 R Axis:   74 Text Interpretation: Sinus tachycardia Nonspecific T wave abnormality Confirmed by Lajean Saver [phone] on 01/01/2023 1:26:30 PM  Radiology US Venous Img Lower  Left (DVT Study)  Result Date: 01/01/2023 CLINICAL DATA:  Pain EXAM: LEFT LOWER EXTREMITY VENOUS DOPPLER ULTRASOUND TECHNIQUE: Gray-scale sonography with graded compression, as  well as color Doppler and duplex ultrasound were performed to evaluate the lower extremity deep venous systems from the level of the common femoral vein and including the common femoral, femoral, profunda femoral, popliteal and calf veins including the posterior tibial, peroneal and gastrocnemius veins when visible. The superficial great saphenous vein was also interrogated. Spectral Doppler was utilized to evaluate flow at rest and with distal augmentation maneuvers in the common femoral, femoral and popliteal veins. COMPARISON:  None Available.  FINDINGS: Contralateral Common Femoral Vein: Respiratory phasicity is normal and symmetric with the symptomatic side. No evidence of thrombus. Normal compressibility. Common Femoral Vein: No evidence of thrombus. Normal compressibility, respiratory phasicity and response to augmentation. Saphenofemoral Junction: No evidence of thrombus. Normal compressibility and flow on color Doppler imaging. Profunda Femoral Vein: No evidence of thrombus. Normal compressibility and flow on color Doppler imaging. Femoral Vein: No evidence of thrombus. Normal compressibility, respiratory phasicity and response to augmentation. Popliteal Vein: No evidence of thrombus. Normal compressibility, respiratory phasicity and response to augmentation. Calf Veins: No evidence of thrombus. Normal compressibility and flow on color Doppler imaging. Superficial Great Saphenous Vein: No evidence of thrombus. Normal compressibility. Venous Reflux:  None. Other Findings:  None. IMPRESSION: No evidence of deep venous thrombosis. Electronically Signed   By: Sammie Bench M.D.   On: 01/01/2023 17:58   DG Cervical Spine Complete  Result Date: 01/01/2023 CLINICAL DATA:  Shoulder pain.  Numbness EXAM: CERVICAL SPINE - COMPLETE 5 VIEW COMPARISON:  None Available. FINDINGS: There is no evidence of cervical spine fracture or prevertebral soft tissue swelling. Alignment is normal. No osseous neural foraminal  stenosis on the oblique views. No other significant bone abnormalities are identified. IMPRESSION: No acute osseous abnormality Electronically Signed   By: Jill Side M.D.   On: 01/01/2023 15:32    Procedures Procedures   Medications Ordered in ED Medications  lidocaine (LIDODERM) 5 % 1 patch (1 patch Transdermal Patch Applied 01/01/23 1555)    ED Course/ Medical Decision Making/ A&P  Medical Decision Making Amount and/or Complexity of Data Reviewed Labs: ordered. Radiology: ordered.  Risk Prescription drug management.   42 year old female presents to the ED for evaluation.  Please see HPI for further details.  On examination patient is afebrile, tachycardic however reports that she has had tachycardia for the last 2 years is unexplained.  Patient currently wearing Holter monitor.  Lung sounds clear bilaterally, not hypoxic.  Abdomen soft and compressible throughout.  Neurological examination without focal neurodeficits.  Patient denies subjective numbness to her bilateral shoulders.  Patient has full range of motion of neck.  Plain film imaging of patient neck is unremarkable.  Patient lab work unremarkable, no leukocytosis or anemia.  Metabolic panel without electrolyte derangement.  DVT ultrasound of left lower extremity negative.   Patient provided Lidoderm patch for left-sided neck pain, reports that her neck pain is decreased at this time.  Patient workup unremarkable.  Patient will be advised to follow-up with her PCP for further management.  Patient provided return precautions and she is voiced understanding.  Patient had all of her questions answered to her satisfaction.  Patient stable for discharge.  Final Clinical Impression(s) / ED Diagnoses Final diagnoses:  Neck pain  Left foot pain    Rx / DC Orders ED Discharge Orders     None         Lawana Chambers 01/01/23 1910    Lajean Saver, MD 01/02/23 [phone]

## 2023-06-13 ENCOUNTER — Ambulatory Visit: Payer: PRIVATE HEALTH INSURANCE | Admitting: Nurse Practitioner

## 2023-06-13 ENCOUNTER — Encounter: Payer: Self-pay | Admitting: Nurse Practitioner

## 2023-06-13 VITALS — BP 118/62 | HR 88 | Temp 98.0°F

## 2023-06-13 DIAGNOSIS — Z789 Other specified health status: Secondary | ICD-10-CM

## 2023-06-13 NOTE — Progress Notes (Signed)
[facility]  Subjective:  Patient ID: Kristi Turner, female    DOB: Jun 01, 1981  Age: 42 y.o. MRN: [id number]  CC: Wellness Visit   HPI Kristi Turner presents for wellness exam visit for insurance benefit.  Patient has a ACZ:YSAYTK McCoy, NP  PMH significant for: Hypertension, Hypercholesterolemia  Last labs per PCP were completed April 2024.   Health Maintenance:  Mammogram: 6 months ago, per patient Pap: 2 years ago, per patient    Smoker:Former tobacco, quit October 2023  Immunizations:  COVIDWater engineer, 2 doses Tdap- 6 years ago, per patient   Lifestyle: Diet-No specific diet followed Exercise- No specific exercise regimen     Past Medical History:  Diagnosis Date   Hypercholesteremia     History reviewed. No pertinent surgical history.  Outpatient Medications Prior to Visit  Medication Sig Dispense Refill   atorvastatin (LIPITOR) 10 MG tablet Take 10 mg by mouth at bedtime.     losartan (COZAAR) 25 MG tablet Take 25 mg by mouth daily.     Magnesium 250 MG TABS Take 1 tablet by mouth 1 day or 1 dose.     DILT-XR 180 MG 24 hr capsule Take 180 mg by mouth at bedtime. (Patient not taking: Reported on 06/13/2023)     Vitamin D, Ergocalciferol, (DRISDOL) 1.25 MG (50000 UNIT) CAPS capsule Take 50,000 Units by mouth every 7 (seven) days. (Patient not taking: Reported on 06/13/2023)     No facility-administered medications prior to visit.    ROS Review of Systems  Constitutional: Negative.   HENT: Negative.    Eyes: Negative.   Respiratory: Negative.    Cardiovascular: Negative.   Gastrointestinal: Negative.   Endocrine: Negative.   Genitourinary: Negative.   Musculoskeletal: Negative.   Skin: Negative.   Neurological: Negative.   Psychiatric/Behavioral: Negative.      Objective:  BP 118/62 (BP Location: Right Arm, Patient Position: Sitting)   Pulse 88   Temp 98 F (36.7 C) (Temporal)   LMP 05/20/2023 (Approximate)   SpO2 99%    Physical Exam Constitutional:      Appearance: Normal appearance. She is not ill-appearing.  HENT:     Mouth/Throat:     Mouth: Mucous membranes are moist.  Eyes:     Pupils: Pupils are equal, round, and reactive to light.  Cardiovascular:     Rate and Rhythm: Normal rate and regular rhythm.  Pulmonary:     Effort: Pulmonary effort is normal.     Breath sounds: Normal breath sounds.  Abdominal:     General: Abdomen is flat.     Palpations: Abdomen is soft.  Musculoskeletal:        General: Normal range of motion.     Cervical back: Normal range of motion.  Skin:    General: Skin is warm and dry.  Neurological:     General: No focal deficit present.     Mental Status: She is alert.  Psychiatric:        Mood and Affect: Mood normal.        Behavior: Behavior normal.      Assessment & Plan:    Lev was seen today for wellness visit.  Diagnoses and all orders for this visit:  Participant in health and wellness plan  Adult wellness physical was conducted today. All screenings and lab work up to date. Patient was advised on importance of yearly wellness exam and follow-up with PCP.  Follow-up: Present to clinic as needed.

## 2024-03-23 IMAGING — CR DG CHEST 2V
2 series · 2 of 2 positions shown · non-contrast
Comparison: 06/01/2020

CLINICAL DATA: Chest pain

EXAM:
CHEST - 2 VIEW

[x chest ap]
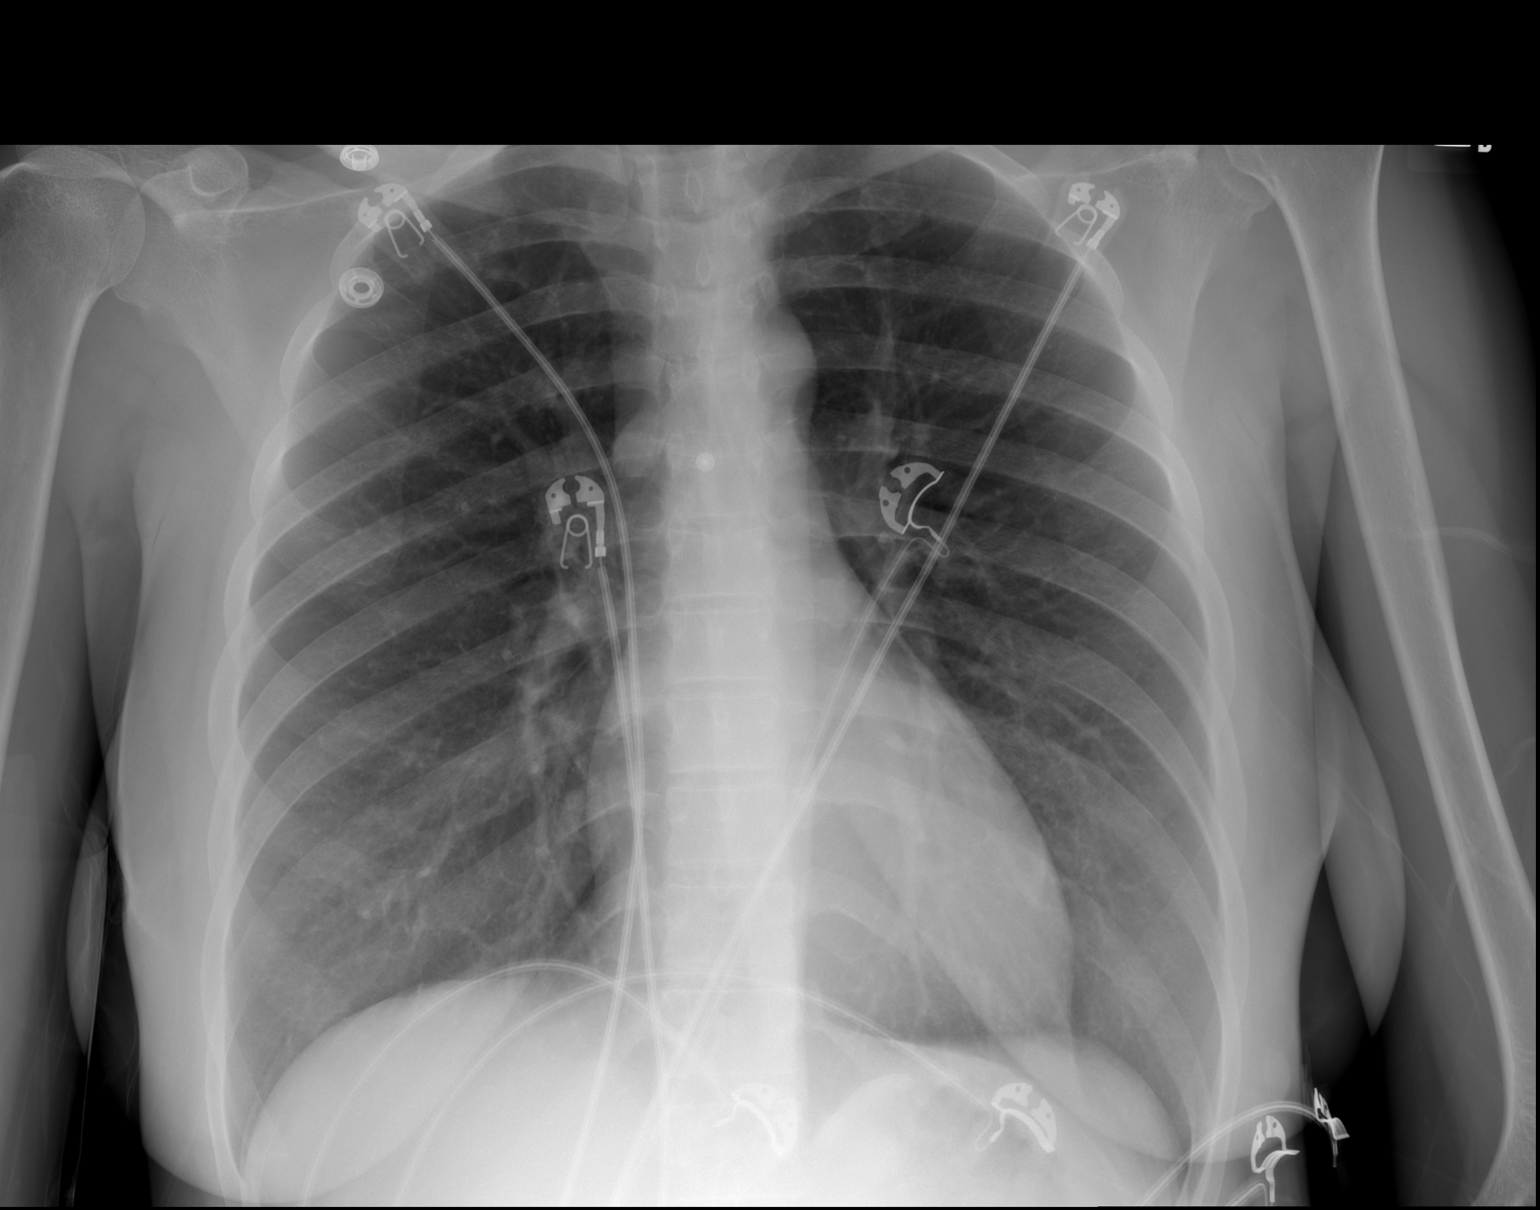

[w chest lat]
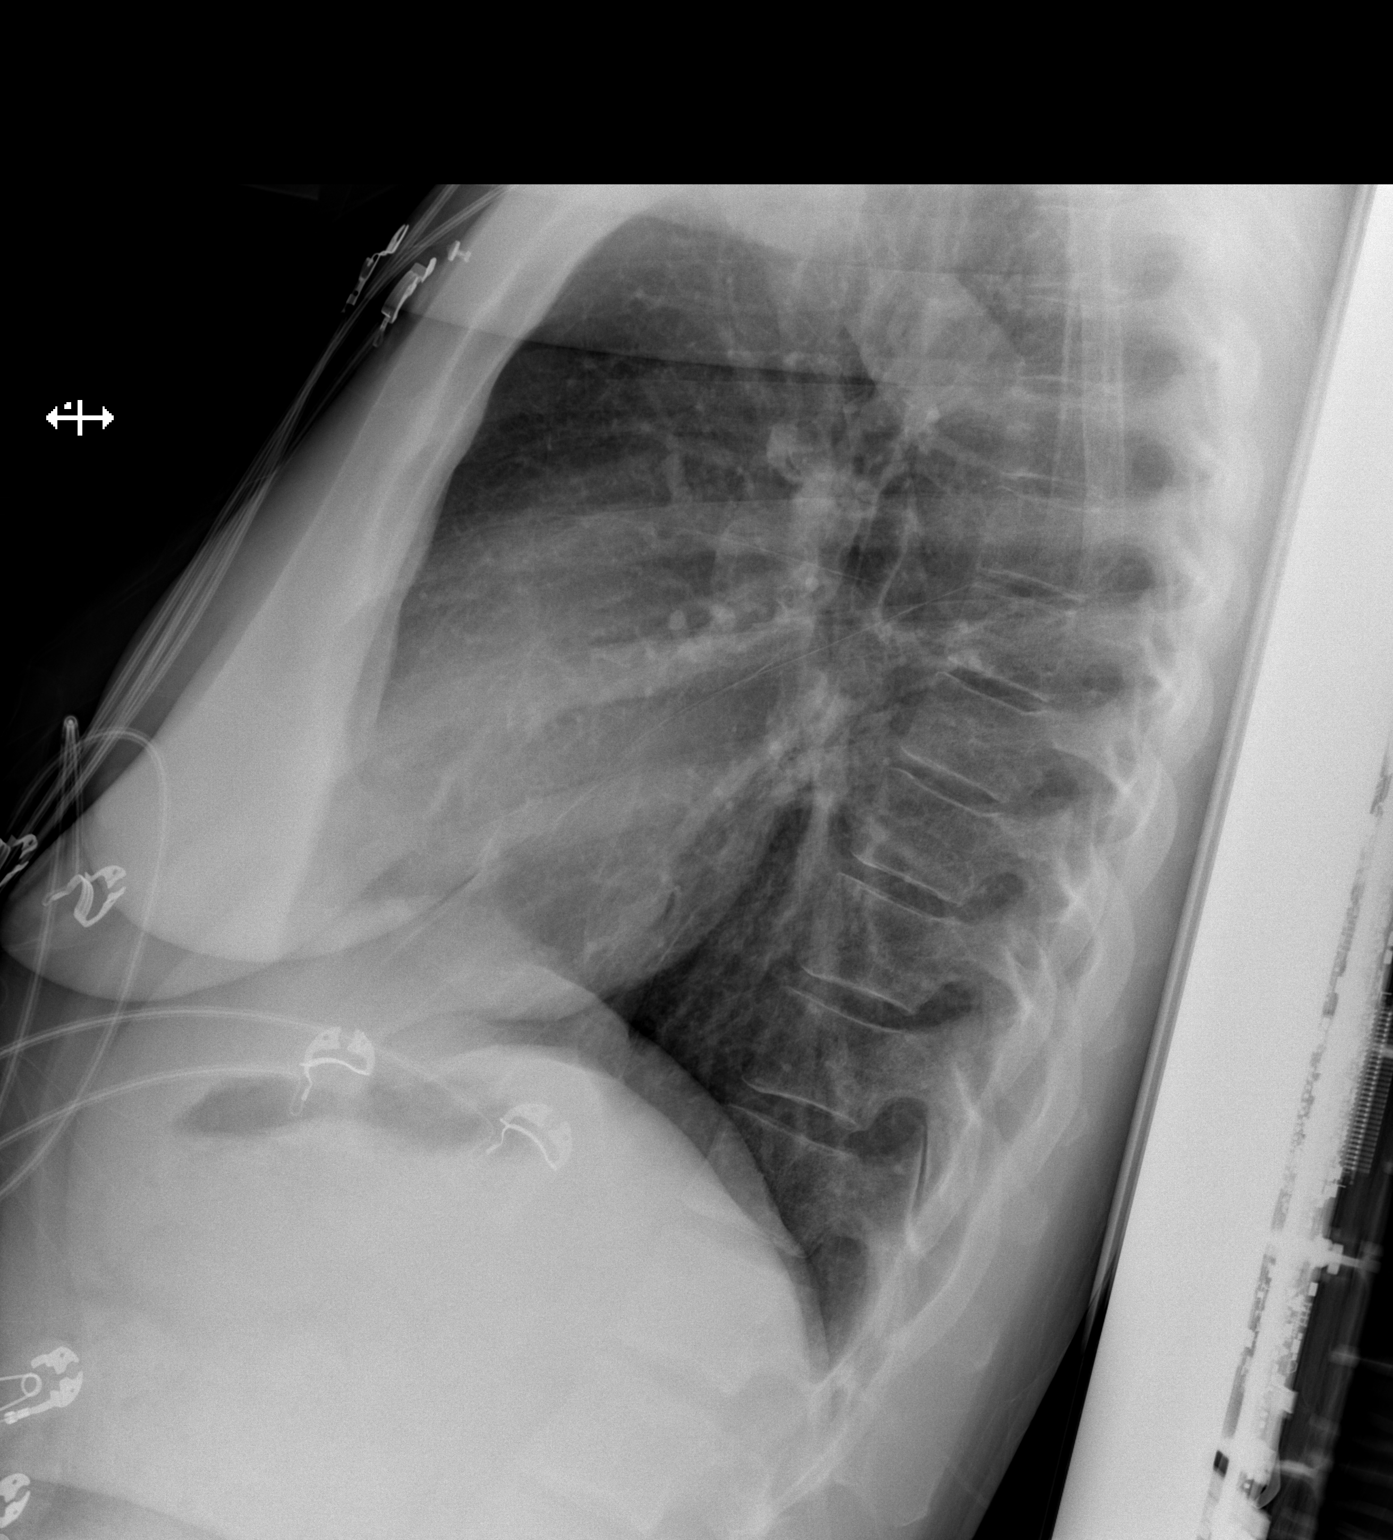

[2 of 2 positions shown; findings below may reference images not displayed]

FINDINGS: The heart size and mediastinal contours are within normal limits.
Both lungs are clear. The visualized skeletal structures are
unremarkable.
IMPRESSION: No active cardiopulmonary disease.
